# Patient Record
Sex: Female | Born: 1955 | Race: White | Hispanic: No
Health system: Southern US, Community
[De-identification: ages and names within clinical notes are randomized; demographics above are authoritative.]

## PROBLEM LIST (undated history)

## (undated) DIAGNOSIS — M3500 Sicca syndrome, unspecified: Secondary | ICD-10-CM

---

## 2020-07-02 ENCOUNTER — Emergency Department (HOSPITAL_COMMUNITY): Payer: No Typology Code available for payment source

## 2020-07-02 ENCOUNTER — Encounter (HOSPITAL_COMMUNITY): Payer: Self-pay

## 2020-07-02 ENCOUNTER — Emergency Department (HOSPITAL_COMMUNITY)
Admission: EM | Admit: 2020-07-02 | Discharge: 2020-07-02 | Disposition: A | Discharge status: Home / Self Care | Payer: No Typology Code available for payment source | Attending: Emergency Medicine | Admitting: Emergency Medicine

## 2020-07-02 DIAGNOSIS — M1990 Unspecified osteoarthritis, unspecified site: Secondary | ICD-10-CM | POA: Diagnosis not present

## 2020-07-02 DIAGNOSIS — M25511 Pain in right shoulder: Secondary | ICD-10-CM | POA: Diagnosis not present

## 2020-07-02 DIAGNOSIS — M545 Low back pain, unspecified: Secondary | ICD-10-CM | POA: Insufficient documentation

## 2020-07-02 DIAGNOSIS — M79641 Pain in right hand: Secondary | ICD-10-CM | POA: Insufficient documentation

## 2020-07-02 DIAGNOSIS — R079 Chest pain, unspecified: Secondary | ICD-10-CM | POA: Diagnosis not present

## 2020-07-02 DIAGNOSIS — S8991XA Unspecified injury of right lower leg, initial encounter: Secondary | ICD-10-CM | POA: Diagnosis present

## 2020-07-02 DIAGNOSIS — S8001XA Contusion of right knee, initial encounter: Secondary | ICD-10-CM | POA: Insufficient documentation

## 2020-07-02 DIAGNOSIS — M25531 Pain in right wrist: Secondary | ICD-10-CM | POA: Diagnosis not present

## 2020-07-02 DIAGNOSIS — M159 Polyosteoarthritis, unspecified: Secondary | ICD-10-CM

## 2020-07-02 DIAGNOSIS — Y92410 Unspecified street and highway as the place of occurrence of the external cause: Secondary | ICD-10-CM | POA: Insufficient documentation

## 2020-07-02 DIAGNOSIS — M7918 Myalgia, other site: Secondary | ICD-10-CM

## 2020-07-02 HISTORY — DX: Sjogren syndrome, unspecified: M35.00

## 2020-07-02 MED ORDER — HYDROCODONE-ACETAMINOPHEN 5-325 MG PO TABS
2.0000 | ORAL_TABLET | Freq: Once | ORAL | Status: AC
  Administered 2020-07-02: 2 via ORAL
  Filled 2020-07-02: qty 2

## 2020-07-02 MED ORDER — TIZANIDINE HCL 2 MG PO TABS: 2.0000 mg | ORAL_TABLET | Freq: Four times a day (QID) | ORAL | 0 refills | Status: AC | PRN

## 2020-07-02 NOTE — ED Notes (Signed)
Pt very pleasant at discharge. Pt cooperative and calm. Pt thanked this nurse for her care.

## 2020-07-02 NOTE — ED Notes (Addendum)
Pt is very unpleasant and verbally aggressive, demanding pain medication, threatening to "write up" any staff that comes in room, unhappy with any attempts to care for pt. Pt will be given pain medication, can drink, and will be discharged as soon as she is medically cleared per PA.
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
## 2020-07-02 NOTE — ED Notes (Signed)
Pt upset due to wait times, refused vital update, pt given patient experience number.

## 2020-07-02 NOTE — Discharge Instructions (Addendum)
X-rays today show multiple areas of osteoarthritis.  Recommend recheck with your primary care provider later this week, seek care in emergency room for worsening or concerning symptoms. Take Zanaflex as needed as prescribed for muscle spasms.  You may also take Tylenol as needed as directed for pain.  Recommend warm compresses for 20 minutes at a time to sore muscles followed by gentle stretching.
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
